# Patient Record
Sex: Female | Born: 1985 | Race: White | Hispanic: Yes | Marital: Single | Smoking: Never smoker
Health system: Southern US, Community
[De-identification: ages and names within clinical notes are randomized; demographics above are authoritative.]

---

## 2019-07-26 ENCOUNTER — Other Ambulatory Visit: Payer: Self-pay

## 2019-07-26 ENCOUNTER — Emergency Department (HOSPITAL_COMMUNITY)
Admission: EM | Admit: 2019-07-26 | Discharge: 2019-07-27 | Disposition: A | Discharge status: Home / Self Care | Payer: No Typology Code available for payment source | Attending: Emergency Medicine | Admitting: Emergency Medicine

## 2019-07-26 ENCOUNTER — Encounter (HOSPITAL_COMMUNITY): Payer: Self-pay | Admitting: Emergency Medicine

## 2019-07-26 ENCOUNTER — Emergency Department (HOSPITAL_COMMUNITY): Payer: No Typology Code available for payment source

## 2019-07-26 DIAGNOSIS — Y92410 Unspecified street and highway as the place of occurrence of the external cause: Secondary | ICD-10-CM | POA: Diagnosis not present

## 2019-07-26 DIAGNOSIS — Y999 Unspecified external cause status: Secondary | ICD-10-CM | POA: Insufficient documentation

## 2019-07-26 DIAGNOSIS — S161XXA Strain of muscle, fascia and tendon at neck level, initial encounter: Secondary | ICD-10-CM | POA: Diagnosis not present

## 2019-07-26 DIAGNOSIS — M7918 Myalgia, other site: Secondary | ICD-10-CM

## 2019-07-26 DIAGNOSIS — Y939 Activity, unspecified: Secondary | ICD-10-CM | POA: Diagnosis not present

## 2019-07-26 DIAGNOSIS — S199XXA Unspecified injury of neck, initial encounter: Secondary | ICD-10-CM | POA: Diagnosis present
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
## 2019-07-27 ENCOUNTER — Emergency Department (HOSPITAL_COMMUNITY): Payer: No Typology Code available for payment source

## 2019-07-27 MED ORDER — CYCLOBENZAPRINE HCL 10 MG PO TABS
10.0000 mg | ORAL_TABLET | Freq: Two times a day (BID) | ORAL | 0 refills | Status: AC | PRN
Start: 2019-07-27 — End: ?

## 2019-07-27 MED ORDER — IBUPROFEN 400 MG PO TABS
400.0000 mg | ORAL_TABLET | Freq: Four times a day (QID) | ORAL | 0 refills | Status: AC | PRN
Start: 2019-07-27 — End: ?

## 2019-07-27 MED ORDER — OXYCODONE-ACETAMINOPHEN 5-325 MG PO TABS
2.0000 | ORAL_TABLET | Freq: Once | ORAL | Status: AC
  Administered 2019-07-27: 2 via ORAL
  Filled 2019-07-27: qty 2

## 2019-07-27 NOTE — ED Notes (Signed)
Pt ambulatory to restroom with no assistance. Steady gait noted.

## 2019-07-27 NOTE — ED Notes (Signed)
Patient verbalizes understanding of discharge instructions. Opportunity for questioning and answers were provided. Armband removed by staff, pt discharged from ED ambulatory.   
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
